# Patient Record
Sex: Male | Born: 1985 | Race: White | Hispanic: No | Marital: Married | Smoking: Never smoker
Health system: Southern US, Community
[De-identification: ages and names within clinical notes are randomized; demographics above are authoritative.]

---

## 2020-06-26 ENCOUNTER — Emergency Department (HOSPITAL_BASED_OUTPATIENT_CLINIC_OR_DEPARTMENT_OTHER)
Admission: EM | Admit: 2020-06-26 | Discharge: 2020-06-26 | Disposition: A | Discharge status: Home / Self Care | Payer: Worker's Compensation | Attending: Emergency Medicine | Admitting: Emergency Medicine

## 2020-06-26 ENCOUNTER — Encounter (HOSPITAL_BASED_OUTPATIENT_CLINIC_OR_DEPARTMENT_OTHER): Payer: Self-pay | Admitting: *Deleted

## 2020-06-26 ENCOUNTER — Emergency Department (HOSPITAL_BASED_OUTPATIENT_CLINIC_OR_DEPARTMENT_OTHER): Payer: Worker's Compensation

## 2020-06-26 ENCOUNTER — Other Ambulatory Visit: Payer: Self-pay

## 2020-06-26 DIAGNOSIS — Y998 Other external cause status: Secondary | ICD-10-CM | POA: Insufficient documentation

## 2020-06-26 DIAGNOSIS — S8991XA Unspecified injury of right lower leg, initial encounter: Secondary | ICD-10-CM | POA: Diagnosis present

## 2020-06-26 DIAGNOSIS — Y92481 Parking lot as the place of occurrence of the external cause: Secondary | ICD-10-CM | POA: Insufficient documentation

## 2020-06-26 DIAGNOSIS — Y9389 Activity, other specified: Secondary | ICD-10-CM | POA: Diagnosis not present

## 2020-06-26 DIAGNOSIS — W19XXXA Unspecified fall, initial encounter: Secondary | ICD-10-CM

## 2020-06-26 DIAGNOSIS — S81811A Laceration without foreign body, right lower leg, initial encounter: Secondary | ICD-10-CM | POA: Diagnosis not present

## 2020-06-26 DIAGNOSIS — W010XXA Fall on same level from slipping, tripping and stumbling without subsequent striking against object, initial encounter: Secondary | ICD-10-CM | POA: Diagnosis not present

## 2020-06-26 MED ORDER — LIDOCAINE-EPINEPHRINE (PF) 2 %-1:200000 IJ SOLN: 20.0000 mL | Freq: Once | INTRAMUSCULAR | Status: AC

## 2020-06-26 MED ORDER — HYDROCODONE-ACETAMINOPHEN 5-325 MG PO TABS
1.0000 | ORAL_TABLET | Freq: Once | ORAL | Status: AC
  Administered 2020-06-26: 1 via ORAL
  Filled 2020-06-26: qty 1

## 2020-06-26 MED ORDER — TETANUS-DIPHTH-ACELL PERTUSSIS 5-2.5-18.5 LF-MCG/0.5 IM SUSP
0.5000 mL | Freq: Once | INTRAMUSCULAR | Status: AC
  Administered 2020-06-26: 0.5 mL via INTRAMUSCULAR
  Filled 2020-06-26: qty 0.5

## 2020-06-26 MED ORDER — LIDOCAINE-EPINEPHRINE (PF) 2 %-1:200000 IJ SOLN
INTRAMUSCULAR | Status: AC
  Administered 2020-06-26: 20 mL
  Filled 2020-06-26: qty 10

## 2020-06-26 NOTE — ED Triage Notes (Signed)
He slid down a hill. Injury to his right lower leg with abrasions and lacerations. He is ambulatory.

## 2020-06-26 NOTE — ED Notes (Signed)
ED Provider at bedside,for lac repair

## 2020-06-26 NOTE — Discharge Instructions (Addendum)
I have placed 12 suture to your right lower leg, please have these removed within 7-10 days.   You may alternate ibuprofen or tylenol for pain.   If you experience any redness, drainage from the wound, or worsening symptoms please return to the ED.

## 2020-06-26 NOTE — ED Provider Notes (Signed)
[facility] EMERGENCY DEPARTMENT Provider Note   CSN: [id number] Arrival date & time: 06/26/20  1349     History Chief Complaint  Patient presents with  . Fall  . Leg Injury  . Laceration    Ralph Mcdaniel is a 34 y.o. male.  34 y.o male with no PMH presents to the ED status post fall.  Patient reports he was ambulating down a parking lot, with a curb when he suddenly tripped, had a laceration to his inner right leg along with his outer right leg.  There is also extensive abrasion noted to the front of his right leg.  Ports pain along the area, attempted to have repair performed at urgent care however they failed to do so.  He had his last tetanus vaccine around 2010.  Has not taken any medication for pain relief. Reports he did not lose consciousness, currently not on any blood thinners, no other injuries.    The history is provided by the patient.  Fall Pertinent negatives include no chest pain, no abdominal pain and no shortness of breath.  Laceration Associated symptoms: no fever        History reviewed. No pertinent past medical history.  There are no problems to display for this patient.   History reviewed. No pertinent surgical history.     No family history on file.  Social History   Tobacco Use  . Smoking status: Never Smoker  . Smokeless tobacco: Never Used  Substance Use Topics  . Alcohol use: Yes  . Drug use: Never    Home Medications Prior to Admission medications   Not on File    Allergies    Patient has no known allergies.  Review of Systems   Review of Systems  Constitutional: Negative for fever.  HENT: Negative for sore throat.   Respiratory: Negative for shortness of breath.   Cardiovascular: Negative for chest pain.  Gastrointestinal: Negative for abdominal pain and nausea.  Musculoskeletal: Positive for myalgias.  Skin: Positive for wound.  All other systems reviewed and are negative.   Physical Exam Updated Vital  Signs BP 117/71   Pulse (!) 102   Temp 98.9 F (37.2 C) (Oral)   Resp 18   Ht 6' (1.829 m)   Wt 80.7 kg   SpO2 96%   BMI 24.14 kg/m   Physical Exam Vitals and nursing note reviewed.  Constitutional:      Appearance: Normal appearance.  HENT:     Head: Normocephalic and atraumatic.     Mouth/Throat:     Mouth: Mucous membranes are moist.  Eyes:     Pupils: Pupils are equal, round, and reactive to light.  Cardiovascular:     Rate and Rhythm: Normal rate.     Pulses:          Dorsalis pedis pulses are 2+ on the right side.       Posterior tibial pulses are 2+ on the right side.  Pulmonary:     Effort: Pulmonary effort is normal.  Abdominal:     General: Abdomen is flat.  Musculoskeletal:     Cervical back: Normal range of motion and neck supple.     Right foot: Normal range of motion and normal capillary refill. Laceration and tenderness present. No deformity, bunion, Charcot foot, foot drop, bony tenderness or crepitus. Normal pulse.     Comments: Pulses present, capillary refill is intact, no decrease in ROM. Please see photos attached.   Skin:  General: Skin is warm and dry.     Findings: Bruising and laceration present.     Comments: Two lacerations to the right distal extremity. Bleeding is controlled.   Neurological:     Mental Status: He is alert and oriented to person, place, and time.     ED Results / Procedures / Treatments   Labs (all labs ordered are listed, but only abnormal results are displayed) Labs Reviewed - No data to display  EKG None  Radiology DG Tibia/Fibula Right  Result Date: 06/26/2020 CLINICAL DATA:  Right lower leg abrasions and lacerations after sliding down asphalt driveway. EXAM: RIGHT TIBIA AND FIBULA - 2 VIEW COMPARISON:  None. FINDINGS: There is no evidence of fracture or other focal bone lesions. Soft tissue irregularity along the lateral proximal lower leg and medial distal lower leg. IMPRESSION: 1. Soft tissue injury along the  lateral proximal lower leg and medial distal lower leg. No acute osseous abnormality. Electronically Signed   By: Obie Dredge M.D.   On: 06/26/2020 15:00    Procedures .Marland KitchenLaceration Repair  Date/Time: 06/26/2020 6:20 PM Performed by: Claude Manges, PA-C Authorized by: Claude Manges, PA-C   Consent:    Consent obtained:  Verbal   Consent given by:  Patient   Risks discussed:  Infection and pain   Alternatives discussed:  No treatment Anesthesia (see MAR for exact dosages):    Anesthesia method:  Local infiltration   Local anesthetic:  Lidocaine 2% WITH epi Laceration details:    Location:  Leg   Leg location:  L lower leg   Length (cm):  4   Depth (mm):  1 Repair type:    Repair type:  Intermediate Treatment:    Area cleansed with:  Saline   Amount of cleaning:  Extensive   Irrigation solution:  Sterile water   Irrigation method:  Pressure wash Subcutaneous repair:    Suture size:  4-0   Suture material:  Vicryl   Suture technique:  Running   Number of sutures:  1 Skin repair:    Repair method:  Sutures   Suture size:  4-0   Suture material:  Prolene   Suture technique:  Simple interrupted   Number of sutures:  5 Approximation:    Approximation:  Close Post-procedure details:    Dressing:  Bulky dressing   Patient tolerance of procedure:  Tolerated well, no immediate complications .Marland KitchenLaceration Repair  Date/Time: 06/26/2020 6:21 PM Performed by: Claude Manges, PA-C Authorized by: Claude Manges, PA-C   Consent:    Consent obtained:  Verbal   Consent given by:  Patient   Risks discussed:  Need for additional repair, pain and poor cosmetic result   Alternatives discussed:  No treatment Anesthesia (see MAR for exact dosages):    Anesthesia method:  Local infiltration   Local anesthetic:  Lidocaine 2% w/o epi Laceration details:    Location:  Leg   Leg location:  R lower leg   Length (cm):  3   Depth (mm):  0.5 Repair type:    Repair type:  Simple Exploration:     Hemostasis achieved with:  Direct pressure Treatment:    Area cleansed with:  Saline   Amount of cleaning:  Extensive   Irrigation solution:  Sterile water Skin repair:    Repair method:  Sutures   Suture size:  4-0   Suture material:  Prolene   Suture technique:  Simple interrupted   Number of sutures:  7 Approximation:  Approximation:  Close Post-procedure details:    Dressing:  Non-adherent dressing   Patient tolerance of procedure:  Tolerated well, no immediate complications   (including critical care time)  Medications Ordered in ED Medications  lidocaine-EPINEPHrine (XYLOCAINE W/EPI) 2 %-1:200000 (PF) injection 20 mL (20 mLs Infiltration Given by Other 06/26/20 1648)  Tdap (BOOSTRIX) injection 0.5 mL (0.5 mLs Intramuscular Given 06/26/20 1711)  HYDROcodone-acetaminophen (NORCO/VICODIN) 5-325 MG per tablet 1 tablet (1 tablet Oral Given 06/26/20 1710)    ED Course  I have reviewed the triage vital signs and the nursing notes.  Pertinent labs & imaging results that were available during my care of the patient were reviewed by me and considered in my medical decision making (see chart for details).  Clinical Course as of Jun 26 1825  Tue Jun 26, 2020  1635 DG Tibia/Fibula Right [LR]    Clinical Course User Index [LR] Roemhildt, Chesley Mires   MDM Rules/Calculators/A&P   Patient with no pertinent past medical history presents to the ED status post mechanical fall.  Patient does have 2 large lacerations noted to the distal aspect of his right leg.  Evaluated in urgent care and sent in for proper repair to the ED.  Currently not on any blood thinners, denies any loss of consciousness, denies any headache.He is neurovascularly intact.   Xray of the right tib/fib showed: 1. Soft tissue injury along the lateral proximal lower leg and  medial distal lower leg. No acute osseous abnormality.      Tetanus vaccine updated, will provide patient with pain control  prior to repair along with extensive irrigation.  I have personally repaired patient's wound extensively, he had 12 stitches placed to his right lower leg, there was a simple interrupted stitch placed to the deeper laceration in the inner part of his left lower leg.  He tolerated the procedure well.  Given Norco for pain control prior to procedure beginning.  He is otherwise in stable condition, recommended follow-up with PCP for suture removal in 7 to 10 days.  Return precautions discussed at length.   Portions of this note were generated with Scientist, clinical (histocompatibility and immunogenetics). Dictation errors may occur despite best attempts at proofreading.  Final Clinical Impression(s) / ED Diagnoses Final diagnoses:  Fall, initial encounter  Leg laceration, right, initial encounter    Rx / DC Orders ED Discharge Orders    None       Claude Manges, PA-C 06/26/20 1826    Melene Plan, DO 06/26/20 1948

## 2021-07-12 IMAGING — DX DG TIBIA/FIBULA 2V*R*
4 series · 4 of 4 positions shown · non-contrast
Comparison: None.

CLINICAL DATA: Right lower leg abrasions and lacerations after
sliding down asphalt driveway.

EXAM:
RIGHT TIBIA AND FIBULA - 2 VIEW

[tibia ap (1 of 2)]
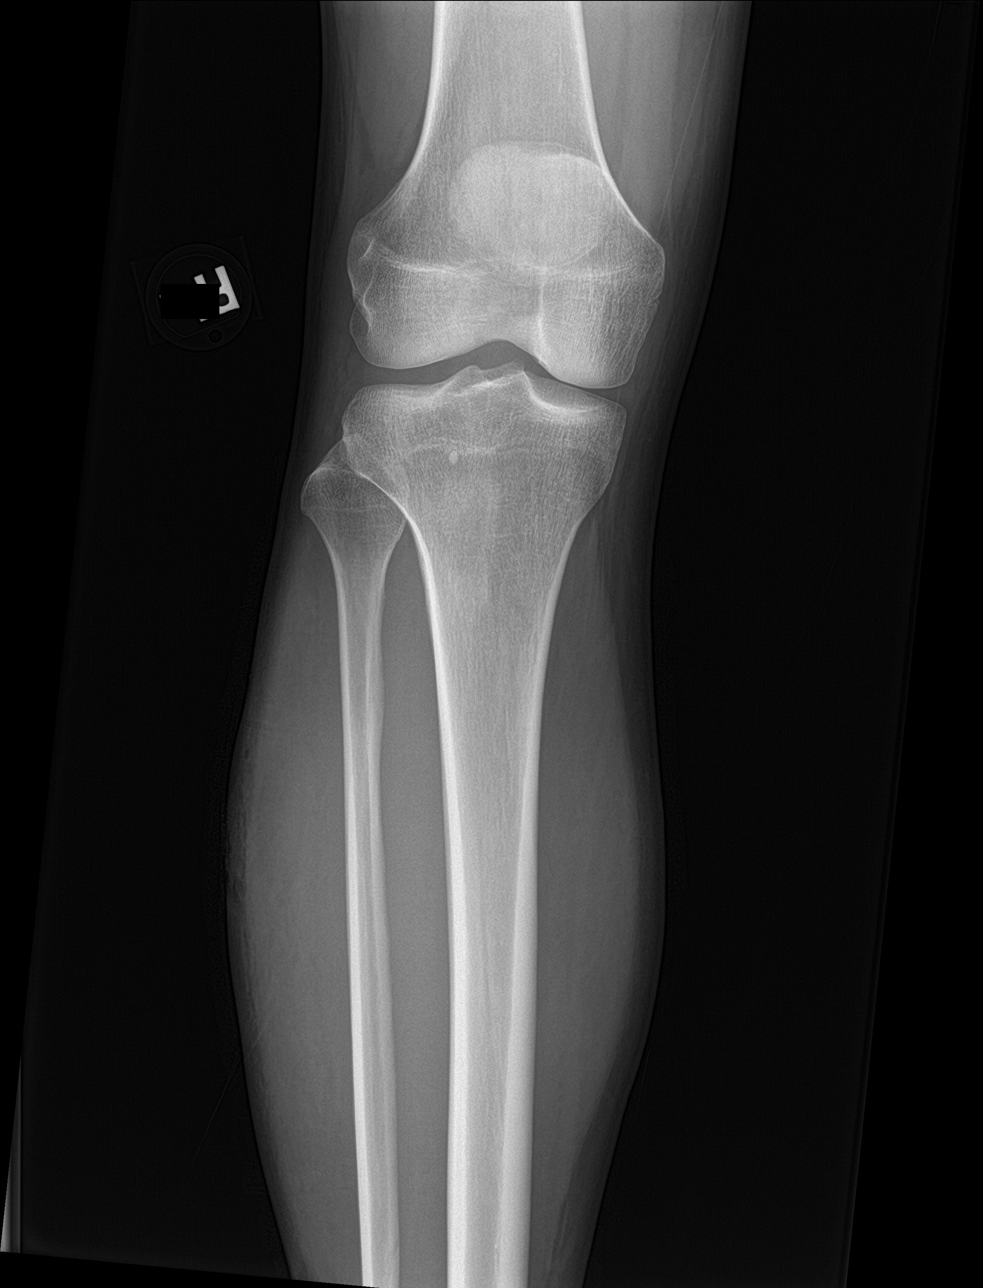

[tibia ap (2 of 2)]
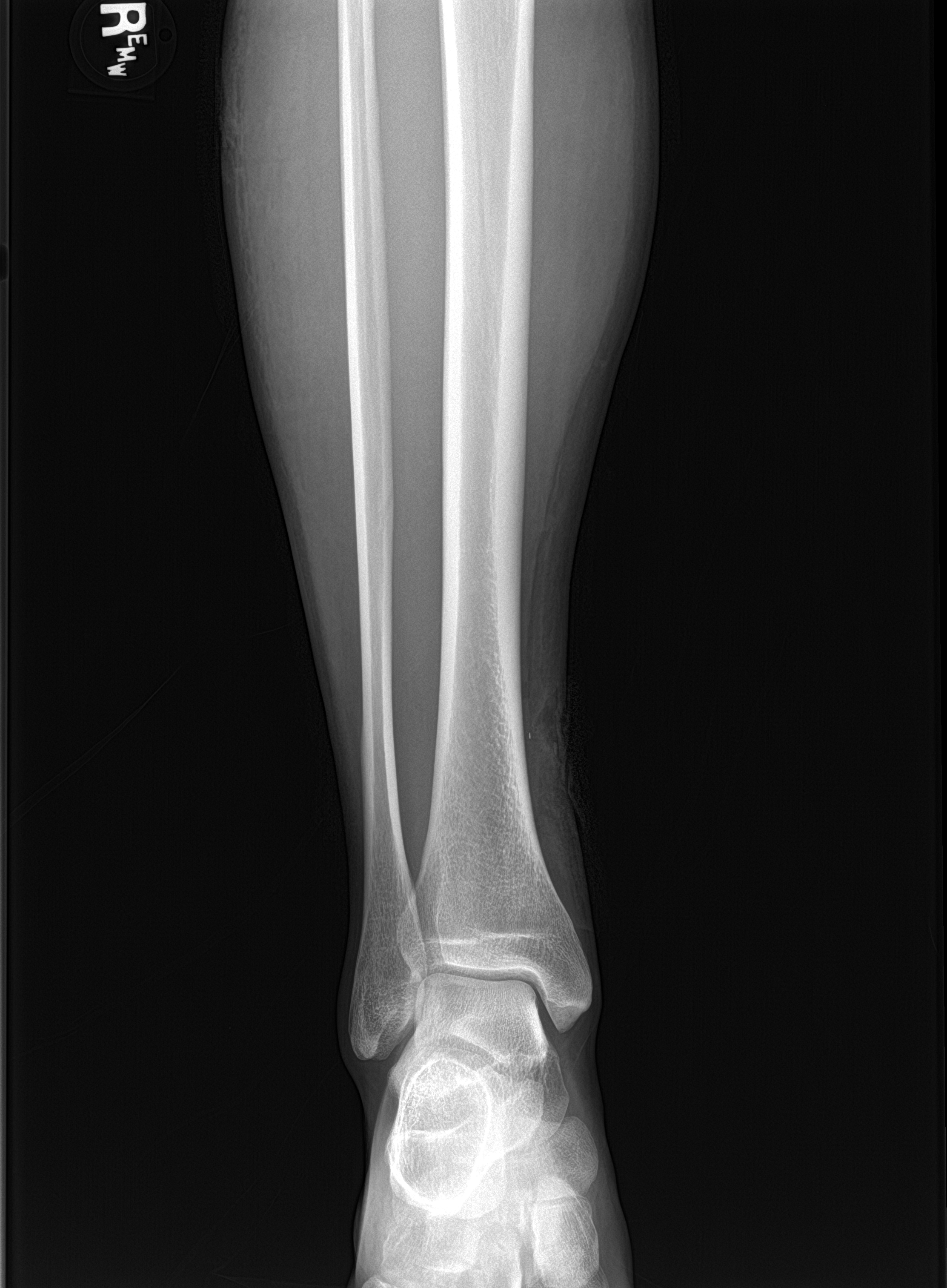

[tibia lat (1 of 2)]
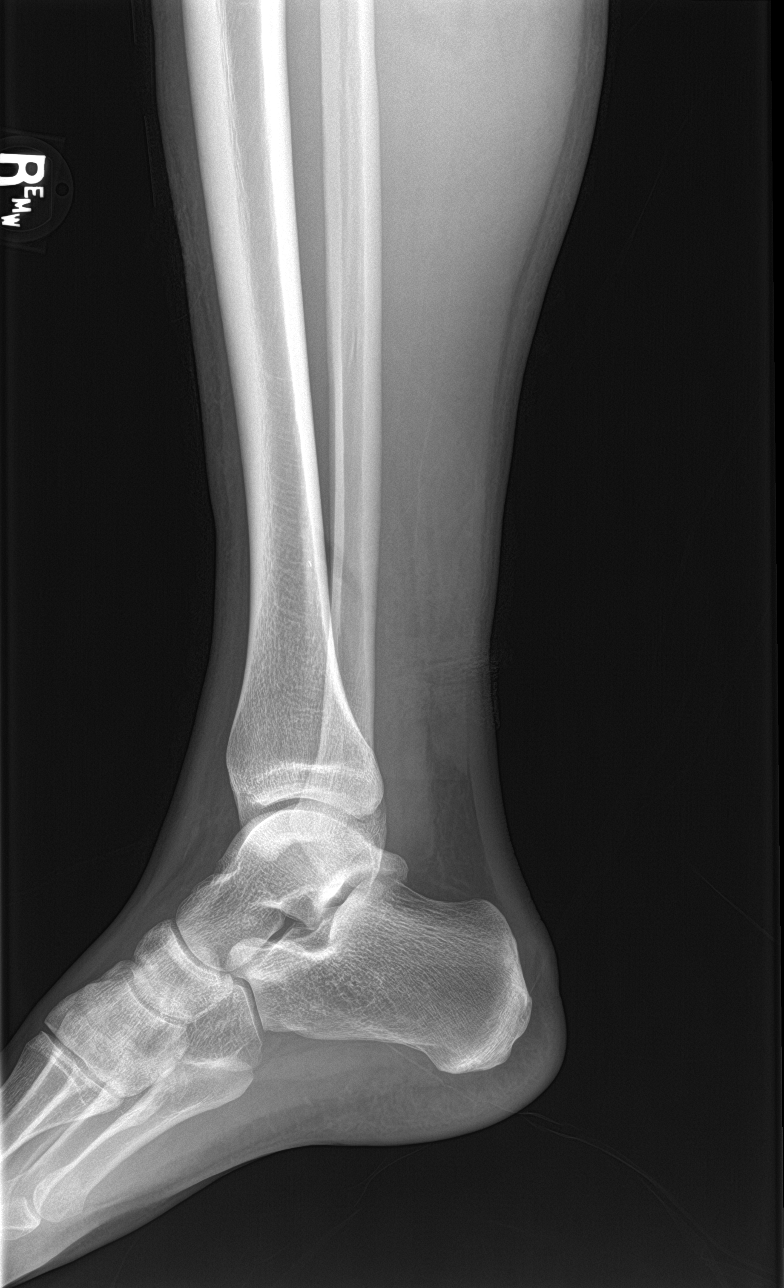

[tibia lat (2 of 2)]
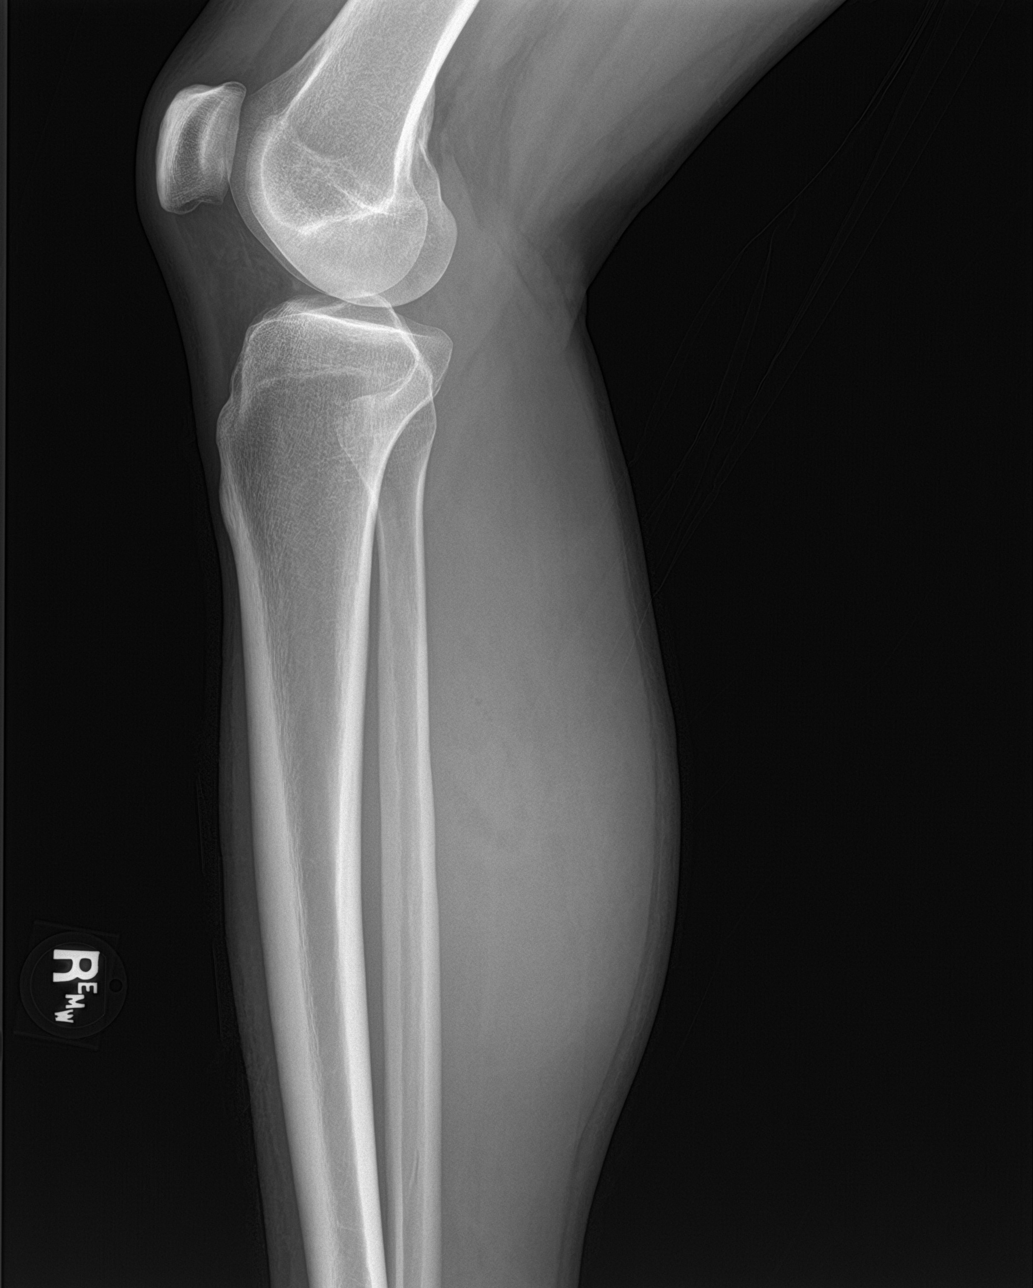

[4 of 4 positions shown; findings below may reference images not displayed]

FINDINGS: There is no evidence of fracture or other focal bone lesions. Soft
tissue irregularity along the lateral proximal lower leg and medial
distal lower leg.
IMPRESSION: 1. Soft tissue injury along the lateral proximal lower leg and
medial distal lower leg. No acute osseous abnormality.
